# Patient Record
Sex: Male | Born: 1945 | Race: White | Hispanic: No | Marital: Married | Smoking: Never smoker
Health system: Southern US, Community
[De-identification: ages and names within clinical notes are randomized; demographics above are authoritative.]

## PROBLEM LIST (undated history)

## (undated) DIAGNOSIS — E785 Hyperlipidemia, unspecified: Secondary | ICD-10-CM

## (undated) DIAGNOSIS — C801 Malignant (primary) neoplasm, unspecified: Secondary | ICD-10-CM

## (undated) DIAGNOSIS — J439 Emphysema, unspecified: Secondary | ICD-10-CM

## (undated) DIAGNOSIS — J449 Chronic obstructive pulmonary disease, unspecified: Secondary | ICD-10-CM

## (undated) HISTORY — DX: Hyperlipidemia, unspecified: E78.5

## (undated) HISTORY — PX: CARPAL TUNNEL RELEASE: SHX101

## (undated) HISTORY — PX: KNEE SURGERY: SHX244

## (undated) HISTORY — DX: Chronic obstructive pulmonary disease, unspecified: J44.9

## (undated) HISTORY — DX: Malignant (primary) neoplasm, unspecified: C80.1

## (undated) HISTORY — PX: HERNIA REPAIR: SHX51

## (undated) HISTORY — DX: Emphysema, unspecified: J43.9

## (undated) HISTORY — PX: CHOLECYSTECTOMY: SHX55

---

## 2013-10-06 ENCOUNTER — Other Ambulatory Visit: Payer: Self-pay | Admitting: Vascular Surgery

## 2013-10-06 DIAGNOSIS — I4891 Unspecified atrial fibrillation: Secondary | ICD-10-CM

## 2013-11-05 ENCOUNTER — Ambulatory Visit (INDEPENDENT_AMBULATORY_CARE_PROVIDER_SITE_OTHER): Payer: Medicare Other | Admitting: General Surgery

## 2013-11-05 ENCOUNTER — Encounter: Payer: Self-pay | Admitting: General Surgery

## 2013-11-05 VITALS — BP 108/66 | HR 82 | Resp 14 | Ht 68.0 in | Wt 180.0 lb

## 2013-11-05 DIAGNOSIS — L723 Sebaceous cyst: Secondary | ICD-10-CM

## 2013-11-05 MED ORDER — DOXYCYCLINE HYCLATE 100 MG PO CAPS: 100.0000 mg | ORAL_CAPSULE | Freq: Every day | ORAL | Status: DC

## 2013-11-05 NOTE — Progress Notes (Signed)
Patient ID: Shane Lara, male   DOB: 12-08-1945, 68 y.o.   MRN: [id number]  Chief Complaint  Patient presents with  . Other    upper back abscess    HPI Shane Lara is a 68 y.o. male here today for a evaluation of a back abscess. Patient states he noticed this area getting worse about 2 days ago. 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  HPI  Past Medical History  Diagnosis Date  . COPD (chronic obstructive pulmonary disease)   . Emphysema of lung   . Cancer     liver  . Hyperlipidemia     Past Surgical History  Procedure Laterality Date  . Hernia repair    . Cholecystectomy    . Knee surgery Left   . Carpal tunnel release Bilateral     History reviewed. No pertinent family history.  Social History History  Substance Use Topics  . Smoking status: Never Smoker   . Smokeless tobacco: Current User  . Alcohol Use: No    No Known Allergies  Current Outpatient Prescriptions  Medication Sig Dispense Refill  . amLODipine-benazepril (LOTREL) 5-20 MG per capsule Take 1 capsule by mouth daily.      . budesonide-formoterol (SYMBICORT) 160-4.5 MCG/ACT inhaler Inhale 2 puffs into the lungs 2 (two) times daily.      . diphenoxylate-atropine (LOMOTIL) 2.5-0.025 MG per tablet Take 1 tablet by mouth 4 (four) times daily as needed for diarrhea or loose stools.      Marland Kitchen doxycycline (VIBRAMYCIN) 100 MG capsule Take 1 capsule (100 mg total) by mouth daily.  10 capsule  0  . esomeprazole (NEXIUM) 40 MG capsule Take 40 mg by mouth daily at 12 noon.      . fentaNYL (DURAGESIC - DOSED MCG/HR) 25 MCG/HR patch Place 25 mcg onto the skin every 3 (three) days.      . folic acid (FOLVITE) 1 MG tablet Take 1 mg by mouth daily.      Marland Kitchen glimepiride (AMARYL) 4 MG tablet Take 4 mg by mouth daily with breakfast.      . hydrochlorothiazide (HYDRODIURIL) 50 MG tablet Take 50 mg by mouth as needed.      . metFORMIN (GLUCOPHAGE) 500 MG tablet Take by mouth 2 (two) times  daily with a meal.      . metoprolol succinate (TOPROL-XL) 50 MG 24 hr tablet Take 50 mg by mouth daily. Take with or immediately following a meal.      . nitroGLYCERIN (NITROSTAT) 0.4 MG SL tablet Place 0.4 mg under the tongue every 5 (five) minutes as needed for chest pain.      Marland Kitchen oxyCODONE-acetaminophen (PERCOCET) 10-325 MG per tablet Take 1 tablet by mouth every 6 (six) hours as needed for pain.      . phenazopyridine (PYRIDIUM) 100 MG tablet Take 100 mg by mouth 3 (three) times daily as needed for pain.      Marland Kitchen tiotropium (SPIRIVA) 18 MCG inhalation capsule Place 18 mcg into inhaler and inhale daily.      Marland Kitchen venlafaxine XR (EFFEXOR-XR) 150 MG 24 hr capsule Take 150 mg by mouth daily with breakfast.       No current facility-administered medications for this visit.    Review of Systems Review of Systems  Constitutional: Negative.   Respiratory: Negative.   Cardiovascular: Negative.     Blood pressure 108/66, pulse 82, resp. rate 14, height 5\' 8"  (1.727 m), weight 180 lb (81.647 kg).  Physical Exam Physical Exam  Constitutional: He is oriented to person, place, and time. He appears well-developed and well-nourished.  Eyes: Conjunctivae are normal.  Neurological: He is alert and oriented to person, place, and time.  Skin: Skin is warm and dry.       Data Reviewed   Assessment    Mildly inflamed skin cyst base of the neck posteriorly     Plan    Rx. doxycyline 100 mg daily 10 x  days  Patient will return after for excision.        SANKAR,SEEPLAPUTHUR G 11/07/2013, 11:12 AM

## 2013-11-05 NOTE — Patient Instructions (Addendum)
Take the full course antibiotic as directed.  You will return once you are done with the antibiotic for exocision of the cyst .

## 2013-11-07 ENCOUNTER — Encounter: Payer: Self-pay | Admitting: General Surgery

## 2013-11-27 ENCOUNTER — Encounter: Payer: Self-pay | Admitting: General Surgery

## 2013-11-27 ENCOUNTER — Ambulatory Visit (INDEPENDENT_AMBULATORY_CARE_PROVIDER_SITE_OTHER): Payer: Medicare Other | Admitting: General Surgery

## 2013-11-27 VITALS — BP 138/72 | HR 68 | Resp 12 | Ht 68.0 in | Wt 178.0 lb

## 2013-11-27 DIAGNOSIS — L723 Sebaceous cyst: Secondary | ICD-10-CM

## 2013-11-27 NOTE — Progress Notes (Signed)
Here today for excision cyst on back of his neck. Patient states he feels better today than he has in the past. 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  After alcohol prep the large cyst area was instilled with 36ml 1% xylocaine mixed with 0.5% Marcaine. Area draped, elliptical incision made around tth 2cm cyst.  Rolled with cautery and ligature of 3-0 vicryl.Cyst and skin excised out.Bleding conto Deeper tissue closed with 3-0 vicryl.  Skin closed with 3-0 and 4-0 nylon.  Dressed with neosporin, telfa and Tegaderm No immediate problems from procedure.

## 2013-11-27 NOTE — Patient Instructions (Signed)
The patient is aware to call back for any questions or concerns.  

## 2013-11-28 LAB — PATHOLOGY

## 2013-12-01 ENCOUNTER — Encounter: Payer: Self-pay | Admitting: General Surgery

## 2013-12-02 ENCOUNTER — Telehealth: Payer: Self-pay | Admitting: *Deleted

## 2013-12-02 NOTE — Telephone Encounter (Signed)
Notified patient as instructed, patient pleased. Discussed follow-up appointments, patient agrees. They were saying if you knew that Dr. Oliva Bustard was wanting a biopsy of his neck node.

## 2013-12-02 NOTE — Telephone Encounter (Signed)
Message copied by Carson Myrtle on Tue Dec 02, 2013 10:50 AM ------      Message from: Christene Lye      Created: Mon Dec 01, 2013  8:53 AM       Please let pt pt know the pathology was normal. ------

## 2013-12-09 ENCOUNTER — Other Ambulatory Visit: Payer: Self-pay | Admitting: General Surgery

## 2013-12-09 ENCOUNTER — Encounter: Payer: Self-pay | Admitting: General Surgery

## 2013-12-09 ENCOUNTER — Other Ambulatory Visit: Payer: PRIVATE HEALTH INSURANCE

## 2013-12-09 ENCOUNTER — Ambulatory Visit: Payer: PRIVATE HEALTH INSURANCE | Admitting: General Surgery

## 2013-12-09 ENCOUNTER — Ambulatory Visit (INDEPENDENT_AMBULATORY_CARE_PROVIDER_SITE_OTHER): Payer: PRIVATE HEALTH INSURANCE | Admitting: General Surgery

## 2013-12-09 VITALS — BP 132/74 | HR 70 | Resp 14 | Ht 68.0 in | Wt 176.0 lb

## 2013-12-09 DIAGNOSIS — C3490 Malignant neoplasm of unspecified part of unspecified bronchus or lung: Secondary | ICD-10-CM | POA: Insufficient documentation

## 2013-12-09 NOTE — Patient Instructions (Signed)
The patient is aware to call back for any questions or concerns.  

## 2013-12-09 NOTE — Progress Notes (Signed)
Patient ID: Shane Lara, male   DOB: 09-07-1945, 68 y.o.   MRN: [id number]  The patient presents for a lymph node biopsy. The patient denies any new problems at this time.   The sutures were removed from the large cyst excision from back of neck. Incision well healed.  Dr. Oliva Bustard had requested a core biopsy of left supraclavicular node for special markers so as to help decide on more effective chemo for his known lung cancer. Pt advised and was agreeable. Completed today.

## 2013-12-10 ENCOUNTER — Ambulatory Visit: Payer: PRIVATE HEALTH INSURANCE

## 2013-12-26 ENCOUNTER — Ambulatory Visit: Payer: Medicare Other | Admitting: Cardiovascular Disease

## 2013-12-26 ENCOUNTER — Encounter: Payer: Self-pay | Admitting: *Deleted

## 2013-12-31 LAB — PATHOLOGY

## 2014-01-16 ENCOUNTER — Ambulatory Visit: Payer: Medicare Other | Admitting: Cardiovascular Disease

## 2014-01-18 DEATH — deceased

## 2014-02-17 DEATH — deceased

## 2014-11-23 IMAGING — CR DG CHEST 1V
1 series · 1 of 1 positions shown · non-contrast
Comparison: PET CT 11/21/2013

CLINICAL DATA: Cough, shortness of breath, initial evaluation,
first history of lung cancer with patient age chemotherapy

EXAM:
CHEST - 1 VIEW

[ap]
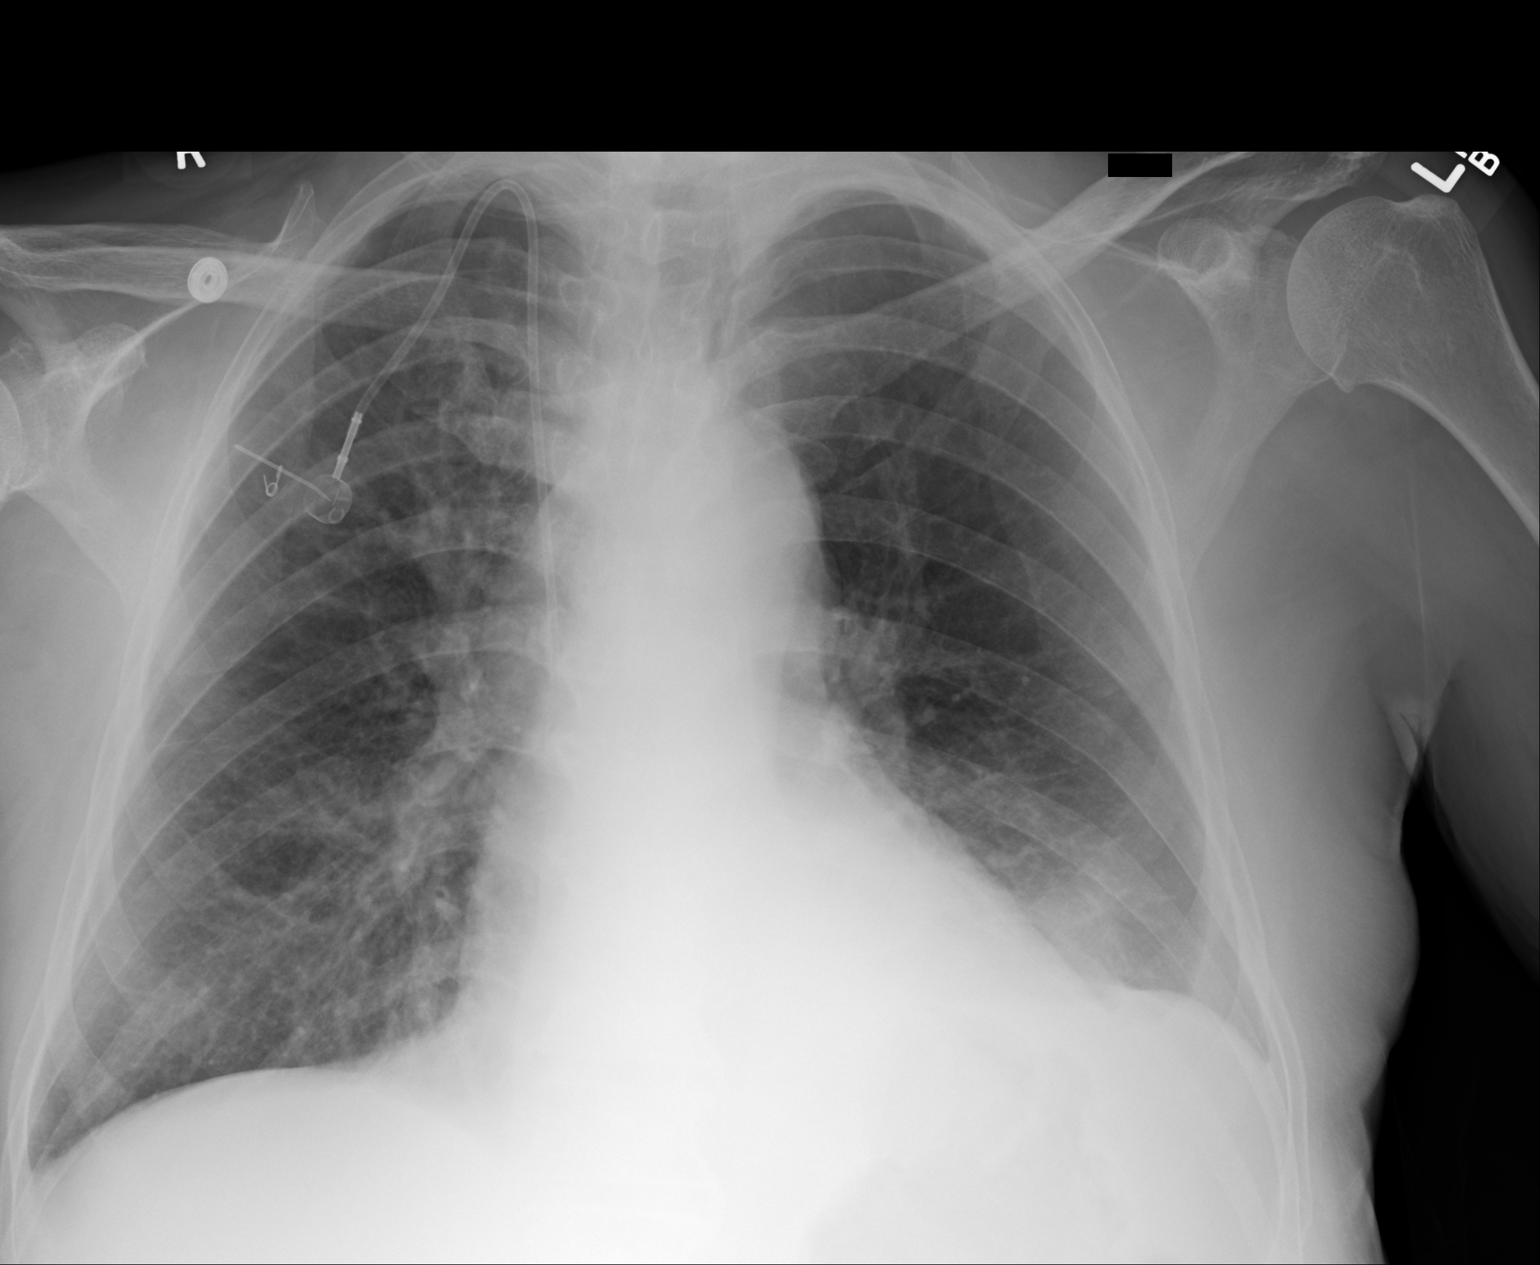

[1 of 1 positions shown; findings below may reference images not displayed]

FINDINGS: Heart size upper normal. Vascular pattern normal. Right Port-A-Cath
identified.

The right lung is clear. On the left, there is infiltrate in the
medial to central inferior lung base.
IMPRESSION: Left lower lobe infiltrate new from 11/21/2013. This could represent
pneumonia.
